# Patient Record
Sex: Male | Born: 1955 | Race: White | Hispanic: No | Smoking: Current every day smoker
Health system: Southern US, Community
[De-identification: ages and names within clinical notes are randomized; demographics above are authoritative.]

## PROBLEM LIST (undated history)

## (undated) DIAGNOSIS — E079 Disorder of thyroid, unspecified: Secondary | ICD-10-CM

## (undated) DIAGNOSIS — I10 Essential (primary) hypertension: Secondary | ICD-10-CM

## (undated) DIAGNOSIS — Z8619 Personal history of other infectious and parasitic diseases: Secondary | ICD-10-CM

## (undated) HISTORY — DX: Personal history of other infectious and parasitic diseases: Z86.19

## (undated) HISTORY — DX: Essential (primary) hypertension: I10

## (undated) HISTORY — DX: Disorder of thyroid, unspecified: E07.9

---

## 2011-12-09 ENCOUNTER — Emergency Department: Payer: Self-pay | Admitting: Emergency Medicine

## 2011-12-09 LAB — CBC
HCT: 47.1 % (ref 40.0–52.0)
HGB: 16.3 g/dL (ref 13.0–18.0)
MCHC: 34.5 g/dL (ref 32.0–36.0)
Platelet: 182 10*3/uL (ref 150–440)
RBC: 5.2 10*6/uL (ref 4.40–5.90)
WBC: 11.9 10*3/uL — ABNORMAL HIGH (ref 3.8–10.6)

## 2011-12-09 LAB — URINALYSIS, COMPLETE
Leukocyte Esterase: NEGATIVE
Nitrite: NEGATIVE
Ph: 7 (ref 4.5–8.0)
Protein: NEGATIVE
RBC,UR: 1 /HPF (ref 0–5)
WBC UR: NONE SEEN /HPF (ref 0–5)

## 2011-12-09 LAB — COMPREHENSIVE METABOLIC PANEL
Albumin: 3.5 g/dL (ref 3.4–5.0)
Alkaline Phosphatase: 105 U/L (ref 50–136)
Anion Gap: 11 (ref 7–16)
BUN: 14 mg/dL (ref 7–18)
Bilirubin,Total: 0.3 mg/dL (ref 0.2–1.0)
Chloride: 105 mmol/L (ref 98–107)
Creatinine: 1.11 mg/dL (ref 0.60–1.30)
EGFR (African American): >60
EGFR (Non-African Amer.): >60
Glucose: 101 mg/dL — ABNORMAL HIGH (ref 65–99)
Potassium: 4 mmol/L (ref 3.5–5.1)
SGPT (ALT): 24 U/L
Sodium: 141 mmol/L (ref 136–145)
Total Protein: 7.5 g/dL (ref 6.4–8.2)

## 2011-12-09 LAB — TROPONIN I: Troponin-I: <0.02 ng/mL

## 2014-11-14 LAB — BASIC METABOLIC PANEL
BUN: 13 mg/dL (ref 4–21)
Creatinine: 1.1 mg/dL (ref 0.6–1.3)
Glucose: 85 mg/dL
Potassium: 3.9 mmol/L (ref 3.4–5.3)
SODIUM: 140 mmol/L (ref 137–147)

## 2014-11-14 LAB — LIPID PANEL
Cholesterol: 162 mg/dL (ref 0–200)
HDL: 31 mg/dL — AB (ref 35–70)
LDL Cholesterol: 104 mg/dL
Triglycerides: 135 mg/dL (ref 40–160)

## 2014-11-14 LAB — TSH: TSH: 3.57 u[IU]/mL (ref 0.41–5.90)

## 2015-11-19 ENCOUNTER — Other Ambulatory Visit: Payer: Self-pay | Admitting: Family Medicine

## 2015-11-25 DIAGNOSIS — E039 Hypothyroidism, unspecified: Secondary | ICD-10-CM

## 2015-11-25 DIAGNOSIS — Z87891 Personal history of nicotine dependence: Secondary | ICD-10-CM | POA: Insufficient documentation

## 2015-11-25 DIAGNOSIS — I10 Essential (primary) hypertension: Secondary | ICD-10-CM

## 2015-11-25 DIAGNOSIS — Z720 Tobacco use: Secondary | ICD-10-CM

## 2015-11-27 ENCOUNTER — Encounter: Payer: Self-pay | Admitting: Family Medicine

## 2015-11-27 ENCOUNTER — Ambulatory Visit (INDEPENDENT_AMBULATORY_CARE_PROVIDER_SITE_OTHER): Payer: BLUE CROSS/BLUE SHIELD | Admitting: Family Medicine

## 2015-11-27 VITALS — BP 120/72 | HR 76 | Temp 98.0°F | Resp 18 | Wt 217.0 lb

## 2015-11-27 DIAGNOSIS — R079 Chest pain, unspecified: Secondary | ICD-10-CM

## 2015-11-27 MED ORDER — DEXLANSOPRAZOLE 60 MG PO CPDR: 60.0000 mg | DELAYED_RELEASE_CAPSULE | Freq: Every day | ORAL | Status: AC

## 2015-11-27 NOTE — Progress Notes (Signed)
Patient: Blake Rich Male    DOB: 12/03/1955   60 y.o.   MRN: [id number] Visit Date: 11/27/2015  Today's Provider: Mila Merryonald Fisher, MD   Chief Complaint  Patient presents with  . Chest Pain    x 2-3 days   Subjective:    Chest Pain  This is a new problem. Episode onset: started 2-3 days ago. The onset quality is sudden. The problem occurs intermittently. The pain is present in the substernal region. The pain is mild. The quality of the pain is described as sharp. The pain does not radiate. Pertinent negatives include no abdominal pain, back pain, claudication, cough, diaphoresis, dizziness, exertional chest pressure, fever, headaches, hemoptysis, irregular heartbeat, leg pain, lower extremity edema, malaise/fatigue, nausea, near-syncope, numbness, orthopnea, palpitations, PND, shortness of breath, sputum production, syncope, vomiting or weakness. He has tried antacids for the symptoms. The treatment provided mild relief.  Patient has tried taking rollaids which he reports didnt really help; pain resolved several hours later then returned the next day. Patient is concerned because both his mother and brother died of heart attack.  He states he noticed pain when he woke up in the morning and persistent for several hours after getting up. Is not exertional.   Lab Results  Component Value Date   CHOL 162 11/14/2014   HDL 31* 11/14/2014   LDLCALC 104 11/14/2014   TRIG 135 11/14/2014        No Known Allergies Previous Medications   ASPIRIN EC 81 MG TABLET    Take 81 mg by mouth daily.   LISINOPRIL-HYDROCHLOROTHIAZIDE (PRINZIDE,ZESTORETIC) 10-12.5 MG TABLET    TAKE 1 TABLET BY MOUTH EVERY DAY    Review of Systems  Constitutional: Negative for fever, chills, malaise/fatigue, diaphoresis, appetite change and fatigue.  Respiratory: Negative for cough, hemoptysis, sputum production, chest tightness, shortness of breath and wheezing.   Cardiovascular: Positive for chest pain.  Negative for palpitations, orthopnea, claudication, syncope, PND and near-syncope.  Gastrointestinal: Negative for nausea, vomiting and abdominal pain.  Musculoskeletal: Negative for back pain.  Neurological: Negative for dizziness, weakness, numbness and headaches.    Social History  Substance Use Topics  . Smoking status: Current Every Day Smoker -- 1.50 packs/day for 40 years    Types: Cigarettes  . Smokeless tobacco: Not on file  . Alcohol Use: No   Objective:   BP 120/72 mmHg  Pulse 76  Temp(Src) 98 F (36.7 C) (Oral)  Resp 18  Wt 217 lb (98.431 kg)  SpO2 97%  Physical Exam  General Appearance:    Alert, cooperative, no distress, obese  Eyes:    PERRL, conjunctiva/corneas clear, EOM's intact       Lungs:     Clear to auscultation bilaterally, respirations unlabored  Heart:    Regular rate and rhythm  Neurologic:   Awake, alert, oriented x 3. No apparent focal neurological           defect.       EKG: No ischemia changes. NSR    Assessment & Plan:     1. Chest pain, unspecified chest pain type Not typical for cardiac pain, but he does have family history CAD and hypertension. He is already on 81mg  ASA. Given samples Dexilant to take while evaluation is underway.  - EKG 12-Lead - CBC - Troponin I - Comprehensive metabolic panel - Myocardial Perfusion Imaging; Future       Mila Merryonald Fisher, MD  [facility]

## 2015-11-28 ENCOUNTER — Telehealth: Payer: Self-pay | Admitting: Family Medicine

## 2015-11-28 DIAGNOSIS — R079 Chest pain, unspecified: Secondary | ICD-10-CM

## 2015-11-28 LAB — COMPREHENSIVE METABOLIC PANEL
ALBUMIN: 3.7 g/dL (ref 3.6–4.8)
ALT: 11 IU/L (ref 0–44)
AST: 11 IU/L (ref 0–40)
Albumin/Globulin Ratio: 1.6 (ref 1.1–2.5)
Alkaline Phosphatase: 92 IU/L (ref 39–117)
BUN / CREAT RATIO: 11 (ref 10–22)
BUN: 13 mg/dL (ref 8–27)
Bilirubin Total: 0.4 mg/dL (ref 0.0–1.2)
CALCIUM: 8.8 mg/dL (ref 8.6–10.2)
CO2: 25 mmol/L (ref 18–29)
Chloride: 102 mmol/L (ref 96–106)
Creatinine, Ser: 1.23 mg/dL (ref 0.76–1.27)
GFR, EST AFRICAN AMERICAN: 73 mL/min/{1.73_m2} (ref >59)
GFR, EST NON AFRICAN AMERICAN: 63 mL/min/{1.73_m2} (ref >59)
GLUCOSE: 90 mg/dL (ref 65–99)
Globulin, Total: 2.3 g/dL (ref 1.5–4.5)
Potassium: 3.8 mmol/L (ref 3.5–5.2)
Sodium: 140 mmol/L (ref 134–144)
Total Protein: 6 g/dL (ref 6.0–8.5)

## 2015-11-28 LAB — TROPONIN I

## 2015-11-28 LAB — CBC
HEMATOCRIT: 46.1 % (ref 37.5–51.0)
HEMOGLOBIN: 15.5 g/dL (ref 12.6–17.7)
MCH: 30.5 pg (ref 26.6–33.0)
MCHC: 33.6 g/dL (ref 31.5–35.7)
MCV: 91 fL (ref 79–97)
Platelets: 196 10*3/uL (ref 150–379)
RBC: 5.09 x10E6/uL (ref 4.14–5.80)
RDW: 13.6 % (ref 12.3–15.4)
WBC: 9.8 10*3/uL (ref 3.4–10.8)

## 2015-11-28 NOTE — Telephone Encounter (Signed)
Code entered.  

## 2015-11-28 NOTE — Telephone Encounter (Signed)
Per [facility] the code used to order stress test should be [id number].

## 2015-12-08 ENCOUNTER — Telehealth: Payer: Self-pay | Admitting: Family Medicine

## 2015-12-08 NOTE — Telephone Encounter (Signed)
PT stated that he was supposed to have a stress test on Monday 12/11/15 but he canceled the appt because they wanted over 400$ up front. Pt stated he just wanted to let Dr. Sherrie MustacheFisher know. Thanks TNP

## 2015-12-11 ENCOUNTER — Ambulatory Visit: Payer: BLUE CROSS/BLUE SHIELD

## 2015-12-28 ENCOUNTER — Other Ambulatory Visit: Payer: Self-pay | Admitting: Family Medicine

## 2016-06-05 ENCOUNTER — Emergency Department: Payer: BLUE CROSS/BLUE SHIELD

## 2016-06-05 ENCOUNTER — Emergency Department
Admission: EM | Admit: 2016-06-05 | Discharge: 2016-06-05 | Disposition: A | Discharge status: Home / Self Care | Payer: BLUE CROSS/BLUE SHIELD | Attending: Emergency Medicine | Admitting: Emergency Medicine

## 2016-06-05 DIAGNOSIS — N200 Calculus of kidney: Secondary | ICD-10-CM

## 2016-06-05 DIAGNOSIS — F17210 Nicotine dependence, cigarettes, uncomplicated: Secondary | ICD-10-CM | POA: Insufficient documentation

## 2016-06-05 DIAGNOSIS — N201 Calculus of ureter: Secondary | ICD-10-CM | POA: Diagnosis not present

## 2016-06-05 DIAGNOSIS — Z79899 Other long term (current) drug therapy: Secondary | ICD-10-CM | POA: Insufficient documentation

## 2016-06-05 DIAGNOSIS — Z7982 Long term (current) use of aspirin: Secondary | ICD-10-CM | POA: Insufficient documentation

## 2016-06-05 DIAGNOSIS — I10 Essential (primary) hypertension: Secondary | ICD-10-CM | POA: Insufficient documentation

## 2016-06-05 DIAGNOSIS — E039 Hypothyroidism, unspecified: Secondary | ICD-10-CM | POA: Insufficient documentation

## 2016-06-05 DIAGNOSIS — R109 Unspecified abdominal pain: Secondary | ICD-10-CM | POA: Diagnosis present

## 2016-06-05 LAB — URINALYSIS COMPLETE WITH MICROSCOPIC (ARMC ONLY)
BILIRUBIN URINE: NEGATIVE
Bacteria, UA: NONE SEEN
GLUCOSE, UA: NEGATIVE mg/dL
KETONES UR: NEGATIVE mg/dL
LEUKOCYTES UA: NEGATIVE
NITRITE: NEGATIVE
Protein, ur: 30 mg/dL — AB
SPECIFIC GRAVITY, URINE: 1.028 (ref 1.005–1.030)
pH: 5 (ref 5.0–8.0)

## 2016-06-05 LAB — BASIC METABOLIC PANEL
ANION GAP: 10 (ref 5–15)
BUN: 16 mg/dL (ref 6–20)
CHLORIDE: 107 mmol/L (ref 101–111)
CO2: 21 mmol/L — ABNORMAL LOW (ref 22–32)
Calcium: 9.1 mg/dL (ref 8.9–10.3)
Creatinine, Ser: 1.36 mg/dL — ABNORMAL HIGH (ref 0.61–1.24)
GFR calc Af Amer: >60 mL/min (ref >60)
GFR, EST NON AFRICAN AMERICAN: 55 mL/min — AB (ref >60)
GLUCOSE: 158 mg/dL — AB (ref 65–99)
POTASSIUM: 3.6 mmol/L (ref 3.5–5.1)
Sodium: 138 mmol/L (ref 135–145)

## 2016-06-05 LAB — CBC
HEMATOCRIT: 50.5 % (ref 40.0–52.0)
HEMOGLOBIN: 17.7 g/dL (ref 13.0–18.0)
MCH: 31.4 pg (ref 26.0–34.0)
MCHC: 35 g/dL (ref 32.0–36.0)
MCV: 89.8 fL (ref 80.0–100.0)
Platelets: 233 10*3/uL (ref 150–440)
RBC: 5.63 MIL/uL (ref 4.40–5.90)
RDW: 13.2 % (ref 11.5–14.5)
WBC: 15.8 10*3/uL — ABNORMAL HIGH (ref 3.8–10.6)

## 2016-06-05 MED ORDER — OXYCODONE HCL 5 MG PO TABS: 5.0000 mg | ORAL_TABLET | Freq: Three times a day (TID) | ORAL | 0 refills | Status: AC | PRN

## 2016-06-05 MED ORDER — MORPHINE SULFATE (PF) 4 MG/ML IV SOLN
4.0000 mg | Freq: Once | INTRAVENOUS | Status: AC
  Administered 2016-06-05: 4 mg via INTRAVENOUS
  Filled 2016-06-05: qty 1

## 2016-06-05 MED ORDER — SODIUM CHLORIDE 0.9 % IV BOLUS (SEPSIS)
1000.0000 mL | Freq: Once | INTRAVENOUS | Status: AC
  Administered 2016-06-05: 1000 mL via INTRAVENOUS

## 2016-06-05 MED ORDER — PROMETHAZINE HCL 25 MG/ML IJ SOLN
INTRAMUSCULAR | Status: AC
  Filled 2016-06-05: qty 1

## 2016-06-05 MED ORDER — KETOROLAC TROMETHAMINE 30 MG/ML IJ SOLN
30.0000 mg | Freq: Once | INTRAMUSCULAR | Status: AC
  Administered 2016-06-05: 30 mg via INTRAVENOUS
  Filled 2016-06-05: qty 1

## 2016-06-05 MED ORDER — FENTANYL CITRATE (PF) 100 MCG/2ML IJ SOLN
INTRAMUSCULAR | Status: AC
  Administered 2016-06-05: 50 ug via INTRAVENOUS
  Filled 2016-06-05: qty 2

## 2016-06-05 MED ORDER — ONDANSETRON HCL 4 MG/2ML IJ SOLN
4.0000 mg | Freq: Once | INTRAMUSCULAR | Status: AC
  Administered 2016-06-05: 4 mg via INTRAVENOUS

## 2016-06-05 MED ORDER — FENTANYL CITRATE (PF) 100 MCG/2ML IJ SOLN
50.0000 ug | INTRAMUSCULAR | Status: AC | PRN
  Administered 2016-06-05 (×2): 50 ug via INTRAVENOUS
  Filled 2016-06-05: qty 2

## 2016-06-05 MED ORDER — ONDANSETRON HCL 4 MG/2ML IJ SOLN
4.0000 mg | Freq: Once | INTRAMUSCULAR | Status: AC
  Administered 2016-06-05: 4 mg via INTRAVENOUS
  Filled 2016-06-05: qty 2

## 2016-06-05 MED ORDER — PROMETHAZINE HCL 25 MG/ML IJ SOLN
25.0000 mg | Freq: Once | INTRAMUSCULAR | Status: AC
  Administered 2016-06-05: 25 mg via INTRAVENOUS
  Filled 2016-06-05: qty 1

## 2016-06-05 MED ORDER — ONDANSETRON HCL 4 MG/2ML IJ SOLN
INTRAMUSCULAR | Status: AC
Start: 2016-06-05 — End: 2016-06-05
  Administered 2016-06-05: 4 mg via INTRAVENOUS
  Filled 2016-06-05: qty 2

## 2016-06-05 MED ORDER — TAMSULOSIN HCL 0.4 MG PO CAPS: 0.4000 mg | ORAL_CAPSULE | Freq: Every day | ORAL | 0 refills | Status: AC

## 2016-06-05 NOTE — ED Triage Notes (Signed)
Pt c/o left flank pain with decreased urinary output since yesterday.. Denies hx of kidney issues in the past

## 2016-06-05 NOTE — Discharge Instructions (Signed)
Return to the emergency department for any increased pain, fever, or painful urination.

## 2016-06-05 NOTE — ED Provider Notes (Signed)
[facility] Emergency Department Provider Note  Time seen: 1:42 PM  I have reviewed the triage vital signs and the nursing notes.   HISTORY  Chief Complaint Flank Pain    HPI Blake Rich is a 60 y.o. male with a past medical history of hypertension who presents the emergency department with left flank pain. According to the patient beginning yesterday evening he developed left flank pain which has worsened. Today the patient has been very nauseated with several episodes of vomiting at home. Denies diarrhea or constipation, states normal bowel movements. Denies dysuria or hematuria. However the patient does state he feels like he needs to urinate but cannot urinate much. Denies fever. Denies history of kidney stones.  Past Medical History:  Diagnosis Date  . History of chicken pox   . History of measles   . Hypertension   . Thyroid disease     Patient Active Problem List   Diagnosis Date Noted  . Hypothyroidism 11/25/2015  . Hypertension 11/25/2015  . History of tobacco abuse 11/25/2015    History reviewed. No pertinent surgical history.  Prior to Admission medications   Medication Sig Start Date End Date Taking? Authorizing Provider  aspirin EC 81 MG tablet Take 81 mg by mouth daily.    Historical Provider, MD  dexlansoprazole (DEXILANT) 60 MG capsule Take 1 capsule (60 mg total) by mouth daily. 11/27/15 12/27/15  Malva Limes, MD  lisinopril-hydrochlorothiazide (PRINZIDE,ZESTORETIC) 10-12.5 MG tablet TAKE 1 TABLET BY MOUTH EVERY DAY 12/28/15   Malva Limes, MD    No Known Allergies  Family History  Problem Relation Age of Onset  . Hypertension Mother   . Heart attack Mother   . Suicidality Father   . Cancer Maternal Grandmother   . Cancer Paternal Grandmother   . Stroke Paternal Grandfather   . Drug abuse Brother   . Heart attack Brother     Social History Social History  Substance Use Topics  . Smoking status: Current Every Day  Smoker    Packs/day: 1.50    Years: 40.00    Types: Cigarettes  . Smokeless tobacco: Never Used  . Alcohol use No    Review of Systems Constitutional: Negative for fever Cardiovascular: Negative for chest pain. Respiratory: Negative for shortness of breath. Gastrointestinal: Left flank pain. Positive for nausea or vomiting. Negative for diarrhea or constipation. Genitourinary: Negative for dysuria. Negative for hematuria. Musculoskeletal: Left flank pain. Neurological: Negative for headache 10-point ROS otherwise negative.  ____________________________________________   PHYSICAL EXAM:  VITAL SIGNS: ED Triage Vitals  Enc Vitals Group     BP 06/05/16 1137 103/72     Pulse Rate 06/05/16 1136 70     Resp 06/05/16 1136 17     Temp 06/05/16 1136 97.8 F (36.6 C)     Temp Source 06/05/16 1136 Oral     SpO2 06/05/16 1136 97 %     Weight 06/05/16 1136 200 lb (90.7 kg)     Height 06/05/16 1136 5\' 10"  (1.778 m)     Head Circumference --      Peak Flow --      Pain Score 06/05/16 1136 10     Pain Loc --      Pain Edu? --      Excl. in GC? --     Constitutional: Alert and oriented. Mild distress due to left flank pain and nausea. Eyes: Normal exam ENT   Head: Normocephalic and atraumatic   Mouth/Throat: Mucous membranes  are moist. Cardiovascular: Normal rate, regular rhythm. No murmur Respiratory: Normal respiratory effort without tachypnea nor retractions. Breath sounds are clear Gastrointestinal: Soft and nontender. No distention.   Musculoskeletal: Nontender with normal range of motion in all extremities.  Neurologic:  Normal speech and language. No gross focal neurologic deficits Skin:  Skin is warm, dry and intact.  Psychiatric: Mood and affect are normal.   ____________________________________________     RADIOLOGY  CT consistent 2-3 millimeter left UVJ stone.   INITIAL IMPRESSION / ASSESSMENT AND PLAN / ED COURSE  Pertinent labs & imaging results  that were available during my care of the patient were reviewed by me and considered in my medical decision making (see chart for details).  The patient presents the emergency department with left flank pain. Patient describes moderate aching pain. Nausea and vomiting today. No dysuria or hematuria. Labs show an elevated white blood cell count of 15,000, urinalysis pending, CT renal scan pending.  CT consistent 2-3 mm left UVJ stone. Patient states his pain is improved after Toradol. Patient will be discharged home with urology follow-up, Percocet and Flomax.   ____________________________________________   FINAL CLINICAL IMPRESSION(S) / ED DIAGNOSES  Ureterolithiasis Left flank pain    Minna AntisKevin Ellah Otte, MD 06/05/16 1540

## 2016-06-05 NOTE — ED Provider Notes (Signed)
-----------------------------------------   3:38 PM on 06/05/2016 -----------------------------------------   Blood pressure (!) 156/98, pulse 82, temperature 97.8 F (36.6 C), temperature source Oral, resp. rate 16, height 5\' 10"  (1.778 m), weight 90.7 kg, SpO2 94 %.  Assuming care from Dr. Lenard LancePaduchowski.  In short, Blake Rich is a 60 y.o. male with a chief complaint of Flank Pain .  Refer to the original H&P for additional details.  The current plan of care is to follow up on the urinalysis to determine if the patient needs antibiotics prior to discharge..   ----------------------------------------- 4:07 PM on 06/05/2016 -----------------------------------------  The patient's urinalysis shows too numerous to count red cells but there is no evidence of leukocytes, nitrites, no bacteria seen, and WBCs 6-30.  In short, no evidence of infection.  Culture pending.  No need for antibiotics at this time.  Discharging per Dr. Awanda MinkPaduchowski's recommendations. Loleta Rose.    Yuvonne Lanahan, MD 06/05/16 [phone]

## 2016-06-05 NOTE — ED Notes (Signed)
Pt. Verbalizes understanding of d/c instructions, prescriptions, and follow-up. VS stable and pain relieved per pt report.  Pt. In NAD at time of d/c and denies further concerns regarding this visit. Pt. Stable at the time of departure from the unit, departing unit by the safest and most appropriate manner per that pt condition and limitations. Pt advised to return to the ED at any time for emergent concerns, or for new/worsening symptoms.

## 2016-06-05 NOTE — ED Notes (Signed)
Pt states he has something wrong with his kidneys on the left - pt states he is unable to void since this am and states since yesterday has only voided small amounts at a time - pt denies history of kidney stone - pt nauseated and vomited x2 since at the er

## 2016-06-06 LAB — URINE CULTURE: CULTURE: NO GROWTH

## 2016-06-26 ENCOUNTER — Other Ambulatory Visit: Payer: Self-pay | Admitting: Family Medicine

## 2017-01-29 ENCOUNTER — Other Ambulatory Visit: Payer: Self-pay | Admitting: Family Medicine

## 2017-03-02 ENCOUNTER — Other Ambulatory Visit: Payer: Self-pay | Admitting: Family Medicine

## 2017-04-05 ENCOUNTER — Other Ambulatory Visit: Payer: Self-pay | Admitting: Family Medicine

## 2017-11-12 ENCOUNTER — Other Ambulatory Visit: Payer: Self-pay | Admitting: Family Medicine

## 2017-11-12 MED ORDER — LISINOPRIL-HYDROCHLOROTHIAZIDE 10-12.5 MG PO TABS: 1.0000 | ORAL_TABLET | Freq: Every day | ORAL | 0 refills | Status: DC

## 2017-11-12 NOTE — Telephone Encounter (Signed)
Please review. Thanks!  

## 2017-11-12 NOTE — Telephone Encounter (Signed)
[facility] faxed a refill request for a 90-days supply for the following medication. Thanks CC  lisinopril-hydrochlorothiazide (PRINZIDE,ZESTORETIC) 10-12.5 MG tablet

## 2017-11-14 ENCOUNTER — Other Ambulatory Visit: Payer: Self-pay | Admitting: Family Medicine

## 2017-11-14 NOTE — Telephone Encounter (Signed)
[facility] faxed refill request for a 90-days supply for the following medication. Thanks CC  lisinopril-hydrochlorothiazide (PRINZIDE,ZESTORETIC) 10-12.5 MG tablet

## 2017-11-14 NOTE — Telephone Encounter (Signed)
Requesting 90 day supply.

## 2017-11-14 NOTE — Telephone Encounter (Signed)
Patient has not been seen since 2017 and needs to make an appointment. Pharmacy can dispense #30 to get by until patient is seen in office.

## 2017-11-18 NOTE — Telephone Encounter (Signed)
Left message to call back  

## 2017-12-29 ENCOUNTER — Other Ambulatory Visit: Payer: Self-pay | Admitting: Family Medicine

## 2017-12-29 MED ORDER — LISINOPRIL-HYDROCHLOROTHIAZIDE 10-12.5 MG PO TABS: 1.0000 | ORAL_TABLET | Freq: Every day | ORAL | 0 refills | Status: DC

## 2017-12-29 NOTE — Telephone Encounter (Signed)
Patient needs refills on  Lisinopril-HCTZ 10-12.5 mg. Called [facility] [address]

## 2018-01-04 IMAGING — CT CT RENAL STONE PROTOCOL
2 of 4 series · 15 of 46 positions shown, 17 images · non-contrast
Comparison: None.

CLINICAL DATA: Left-sided abdominal pain with nausea, vomiting and
decreased urinary output since yesterday. No history of kidney
stones, malignancy or prior relevant surgery.

EXAM:
CT ABDOMEN AND PELVIS WITHOUT CONTRAST
TECHNIQUE: Multidetector CT imaging of the abdomen and pelvis was performed
following the standard protocol without IV contrast.

[Series 2: axial st · axial · 0.73mm/px · z∈[-953,-503]mm · 12 of 104 slices shown, 14 images]
[im 9/104  soft-tissue]
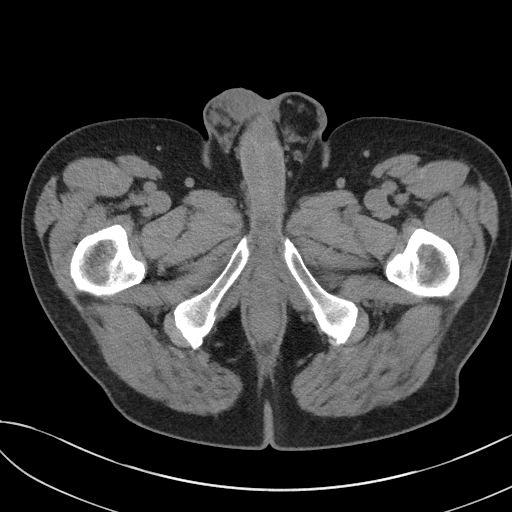
[im 9/104  bone]
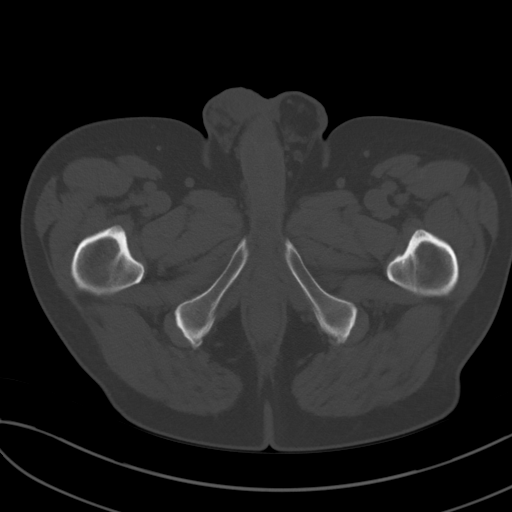
[im 17/104  soft-tissue]
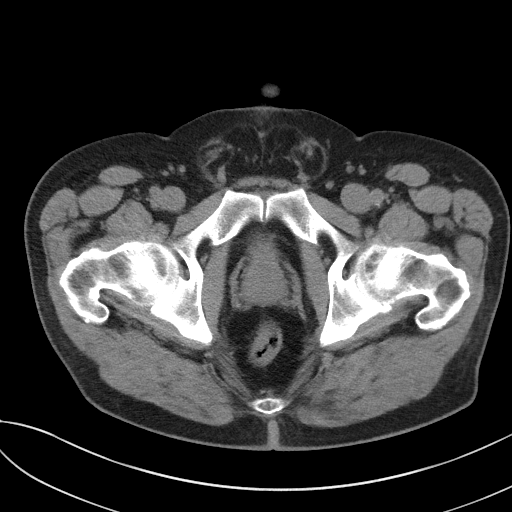
[im 25/104  soft-tissue]
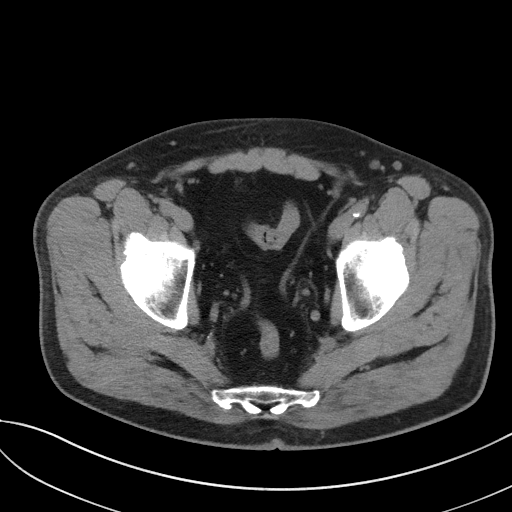
[im 33/104  soft-tissue]
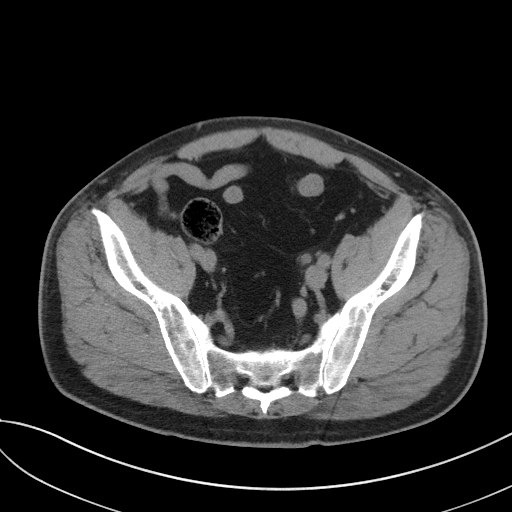
[im 42/104  soft-tissue]
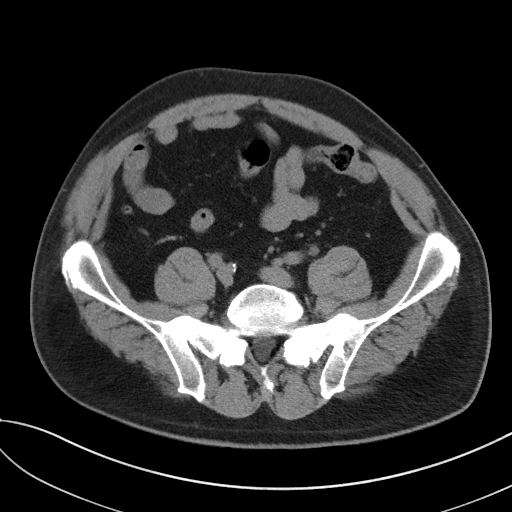
[im 50/104  soft-tissue]
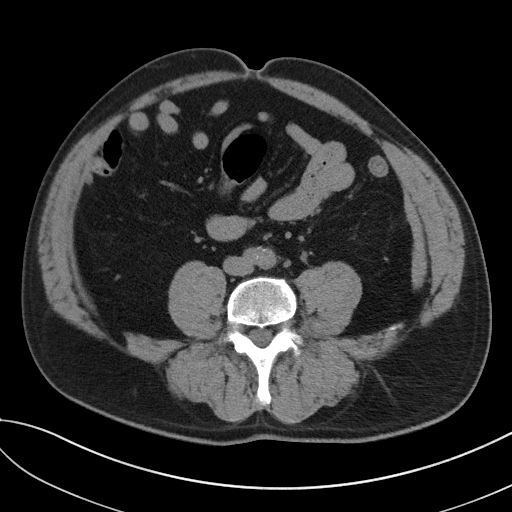
[im 58/104  soft-tissue]
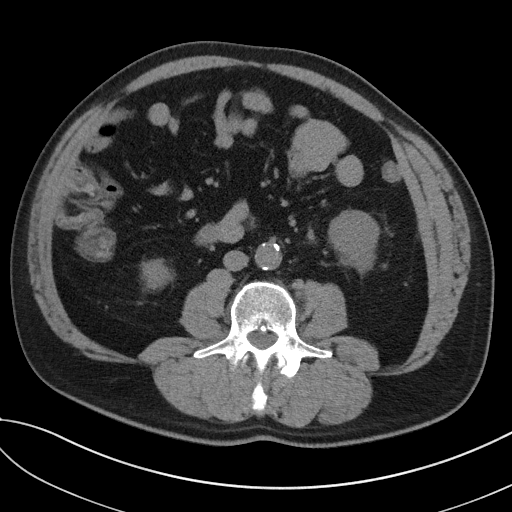
[im 66/104  soft-tissue]
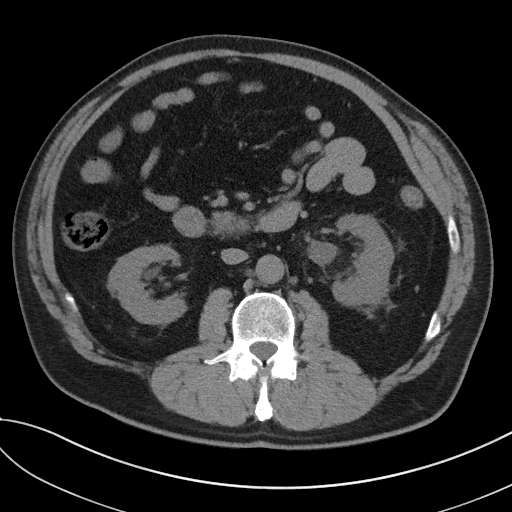
[im 75/104  soft-tissue]
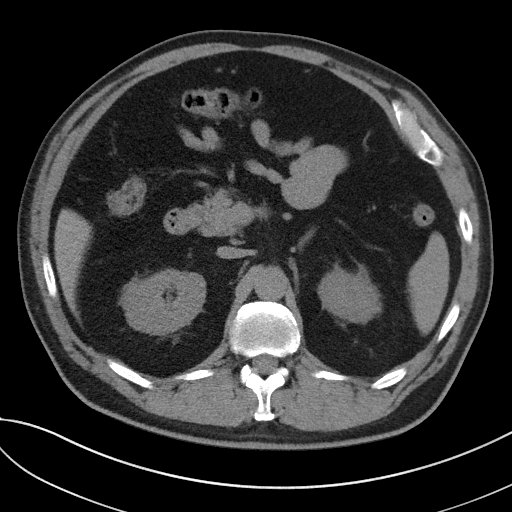
[im 75/104  bone]
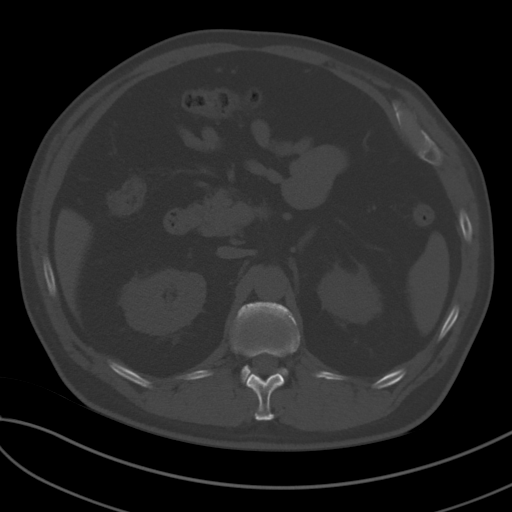
[im 83/104  soft-tissue]
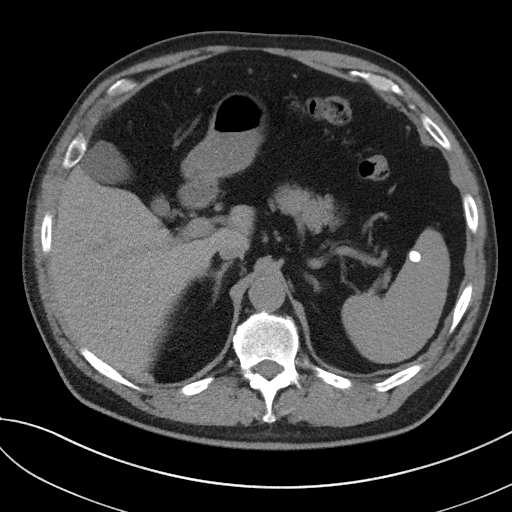
[im 91/104  soft-tissue]
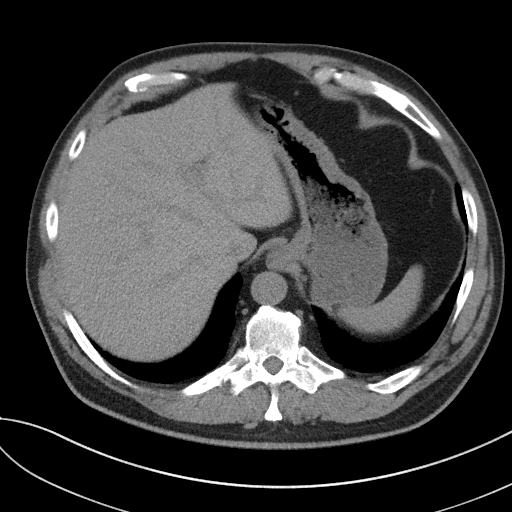
[im 99/104  soft-tissue]
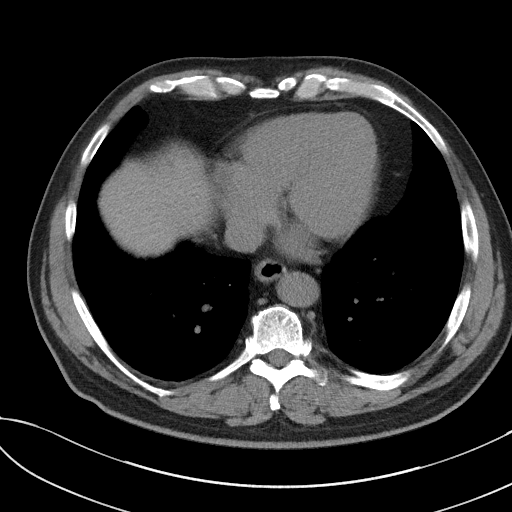

[Series 6: coronal · coronal · 0.82mm/px · 3 of 155 slices shown]
[im 52/155  soft-tissue]
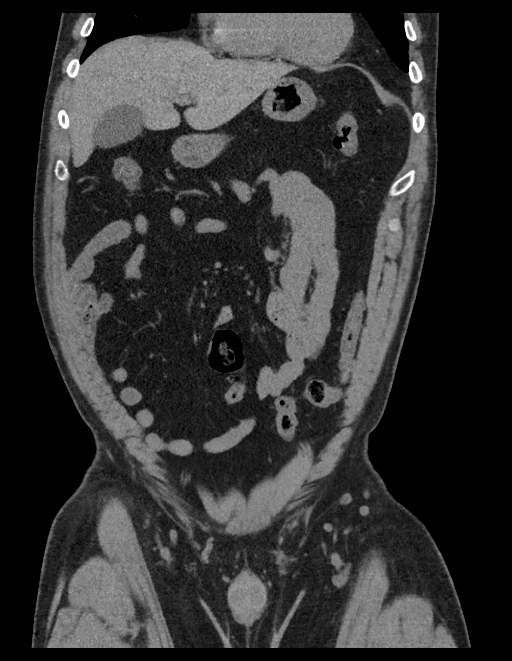
[im 69/155  soft-tissue]
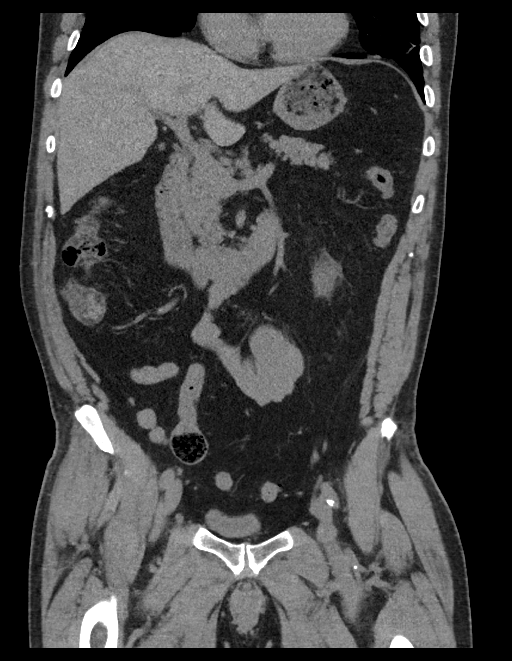
[im 86/155  soft-tissue]
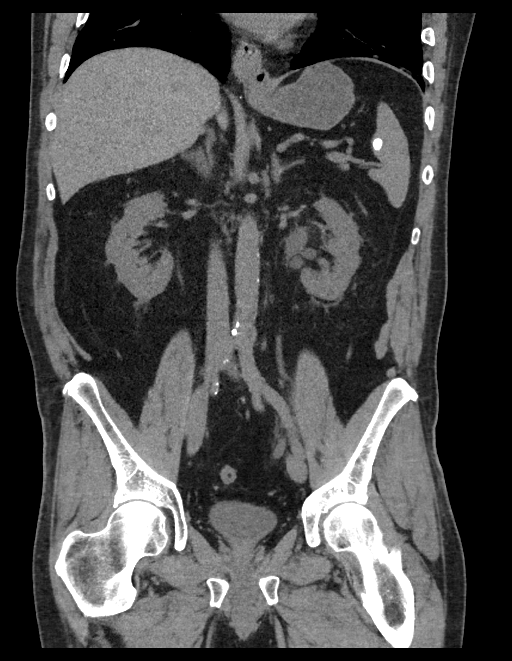

[15 of 46 positions shown; findings below may reference images not displayed]

FINDINGS: Lower chest: Clear lung bases. No significant pleural or pericardial
effusion. There is a small hiatal hernia.

Hepatobiliary: As evaluated in the noncontrast state, the liver
appears unremarkable without focal abnormality. No evidence of
gallstones, gallbladder wall thickening or biliary dilatation.

Pancreas: Unremarkable. No pancreatic ductal dilatation or
surrounding inflammatory changes.

Spleen: Calcified granulomas are noted. The spleen is normal in size
without focally suspicious finding.

Adrenals/Urinary Tract: Both adrenal glands appear normal. There is
left-sided hydronephrosis and hydroureter secondary to a 2 or 3 mm
calculus at the left ureterovesical junction (image 84). No other
urinary tract calculi are demonstrated. There is asymmetric
perinephric and periureteral soft tissue stranding on the left.
There is a small exophytic lesion projecting posteriorly from the
mid right kidney. This measures 23 HU and 11 mm on image 35. No
other focal renal lesions are identified on noncontrast imaging. The
bladder appears unremarkable for its degree of distention.

Stomach/Bowel: No evidence of bowel wall thickening, distention or
surrounding inflammatory change. The appendix appears normal.

Vascular/Lymphatic: There are no enlarged abdominal or pelvic lymph
nodes. Mild aortic and branch vessel atherosclerosis.

Reproductive: The prostate gland and seminal vesicles appear
unremarkable.

Other: No evidence of abdominal wall mass or hernia.

Musculoskeletal: No acute or significant osseous findings. There are
degenerative changes and partial ankylosis of the sacroiliac joints
bilaterally.
IMPRESSION: 1. Obstructing 2 or 3 mm calculus at the left ureterovesical
junction.
2. No other urinary tract calculi.
3. Indeterminate small exophytic lesion in the mid right kidney. The
patient had a prior abdominal CT 03/25/1996 which is not available
for comparison. This did report a "small low-attenuation slight
exophytic lesion from the posterior margin of the midportion of the
right kidney". Assuming this is the same lesion, this is probably a
mildly complex cyst, although is incompletely characterized by this
noncontrast study.

## 2018-01-06 ENCOUNTER — Telehealth: Payer: Self-pay | Admitting: Family Medicine

## 2018-01-06 NOTE — Telephone Encounter (Signed)
Pt called stated that his lisinopril-hydrochlorothiazide (PRINZIDE,ZESTORETIC) 10-12.5 MG tablet was only filled for 10 days. I explained that was because Dr. Sherrie MustacheFisher needs pt to come in for follow up for more refills. Pt stated that he wasn't going to come in when there isn't anything wrong with him. I advised pt that in order to continue prescribing the medication we have to see pt for follow ups to make sure the medication is appropriate. Pt stated no it wasn't that Dr. Sherrie MustacheFisher just wanted to get 300$ from his insurance. I advised pt that he hasn't been seen since March 2017 and offered to schedule appt. Pt stated no that he would get this handled and hung up. Please advise. Thanks TNP

## 2018-01-06 NOTE — Telephone Encounter (Signed)
Please advise 

## 2018-01-09 ENCOUNTER — Other Ambulatory Visit: Payer: Self-pay | Admitting: Family Medicine

## 2018-01-09 MED ORDER — LISINOPRIL-HYDROCHLOROTHIAZIDE 10-12.5 MG PO TABS
1.0000 | ORAL_TABLET | Freq: Every day | ORAL | 0 refills | Status: DC
Start: 2018-01-09 — End: 2018-02-09

## 2018-01-09 NOTE — Telephone Encounter (Signed)
I have sent a 30 day prescription for this patient to his pharmacy. He needs to be discharged from practice for non-compliance with required follow up.

## 2018-01-09 NOTE — Progress Notes (Unsigned)
I have sent a 30 day prescription for this patient to his pharmacy. He needs to be discharged from practice for non-compliance with required follow up.  

## 2018-01-12 ENCOUNTER — Other Ambulatory Visit: Payer: Self-pay | Admitting: Family Medicine

## 2018-01-12 NOTE — Telephone Encounter (Signed)
Patient has not been seen for follow up of hypertension medications since November 27, 2015 and refuses to return for follow up. Due to non-compliance with medically necessary follow up of medications a letter is being sent to discharge him from practice.

## 2018-02-09 ENCOUNTER — Other Ambulatory Visit: Payer: Self-pay | Admitting: Family Medicine

## 2023-11-21 ENCOUNTER — Emergency Department
Admission: EM | Admit: 2023-11-21 | Discharge: 2023-11-21 | Disposition: A | Discharge status: Home / Self Care | Payer: Medicare PPO | Attending: Emergency Medicine | Admitting: Emergency Medicine

## 2023-11-21 ENCOUNTER — Emergency Department: Payer: Medicare PPO

## 2023-11-21 ENCOUNTER — Other Ambulatory Visit: Payer: Self-pay

## 2023-11-21 DIAGNOSIS — I10 Essential (primary) hypertension: Secondary | ICD-10-CM | POA: Diagnosis not present

## 2023-11-21 DIAGNOSIS — N179 Acute kidney failure, unspecified: Secondary | ICD-10-CM | POA: Diagnosis not present

## 2023-11-21 DIAGNOSIS — R0602 Shortness of breath: Secondary | ICD-10-CM | POA: Diagnosis present

## 2023-11-21 DIAGNOSIS — R55 Syncope and collapse: Secondary | ICD-10-CM | POA: Insufficient documentation

## 2023-11-21 DIAGNOSIS — F17200 Nicotine dependence, unspecified, uncomplicated: Secondary | ICD-10-CM | POA: Diagnosis not present

## 2023-11-21 DIAGNOSIS — J40 Bronchitis, not specified as acute or chronic: Secondary | ICD-10-CM | POA: Insufficient documentation

## 2023-11-21 DIAGNOSIS — E860 Dehydration: Secondary | ICD-10-CM | POA: Diagnosis not present

## 2023-11-21 DIAGNOSIS — R079 Chest pain, unspecified: Secondary | ICD-10-CM

## 2023-11-21 LAB — COMPREHENSIVE METABOLIC PANEL
ALT: 45 U/L — ABNORMAL HIGH (ref 0–44)
AST: 40 U/L (ref 15–41)
Albumin: 3.4 g/dL — ABNORMAL LOW (ref 3.5–5.0)
Alkaline Phosphatase: 78 U/L (ref 38–126)
Anion gap: 15 (ref 5–15)
BUN: 19 mg/dL (ref 8–23)
CO2: 21 mmol/L — ABNORMAL LOW (ref 22–32)
Calcium: 7.9 mg/dL — ABNORMAL LOW (ref 8.9–10.3)
Chloride: 95 mmol/L — ABNORMAL LOW (ref 98–111)
Creatinine, Ser: 1.86 mg/dL — ABNORMAL HIGH (ref 0.61–1.24)
GFR, Estimated: 39 mL/min — ABNORMAL LOW (ref >60)
Glucose, Bld: 189 mg/dL — ABNORMAL HIGH (ref 70–99)
Potassium: 3.4 mmol/L — ABNORMAL LOW (ref 3.5–5.1)
Sodium: 131 mmol/L — ABNORMAL LOW (ref 135–145)
Total Bilirubin: 0.9 mg/dL (ref 0.0–1.2)
Total Protein: 6.5 g/dL (ref 6.5–8.1)

## 2023-11-21 LAB — CBC WITH DIFFERENTIAL/PLATELET
Abs Immature Granulocytes: 0.03 10*3/uL (ref 0.00–0.07)
Basophils Absolute: 0 10*3/uL (ref 0.0–0.1)
Basophils Relative: 0 %
Eosinophils Absolute: 0 10*3/uL (ref 0.0–0.5)
Eosinophils Relative: 0 %
HCT: 46.2 % (ref 39.0–52.0)
Hemoglobin: 15.7 g/dL (ref 13.0–17.0)
Immature Granulocytes: 0 %
Lymphocytes Relative: 16 %
Lymphs Abs: 1.2 10*3/uL (ref 0.7–4.0)
MCH: 31.1 pg (ref 26.0–34.0)
MCHC: 34 g/dL (ref 30.0–36.0)
MCV: 91.5 fL (ref 80.0–100.0)
Monocytes Absolute: 0.7 10*3/uL (ref 0.1–1.0)
Monocytes Relative: 9 %
Neutro Abs: 5.3 10*3/uL (ref 1.7–7.7)
Neutrophils Relative %: 75 %
Platelets: 181 10*3/uL (ref 150–400)
RBC: 5.05 MIL/uL (ref 4.22–5.81)
RDW: 13.2 % (ref 11.5–15.5)
WBC: 7.2 10*3/uL (ref 4.0–10.5)
nRBC: 0 % (ref 0.0–0.2)

## 2023-11-21 LAB — D-DIMER, QUANTITATIVE: D-Dimer, Quant: 0.7 ug{FEU}/mL — ABNORMAL HIGH (ref 0.00–0.50)

## 2023-11-21 LAB — APTT: aPTT: 32 s (ref 24–36)

## 2023-11-21 LAB — PROTIME-INR
INR: 1.1 (ref 0.8–1.2)
Prothrombin Time: 14.1 s (ref 11.4–15.2)

## 2023-11-21 LAB — BLOOD GAS, VENOUS
Acid-base deficit: 2.2 mmol/L — ABNORMAL HIGH (ref 0.0–2.0)
Bicarbonate: 24.7 mmol/L (ref 20.0–28.0)
O2 Saturation: 54.2 %
Patient temperature: 37
pCO2, Ven: 49 mmHg (ref 44–60)
pH, Ven: 7.31 (ref 7.25–7.43)
pO2, Ven: 33 mmHg (ref 32–45)

## 2023-11-21 LAB — T4, FREE: Free T4: 1.24 ng/dL — ABNORMAL HIGH (ref 0.61–1.12)

## 2023-11-21 LAB — LIPASE, BLOOD: Lipase: 26 U/L (ref 11–51)

## 2023-11-21 LAB — TROPONIN I (HIGH SENSITIVITY)
Troponin I (High Sensitivity): 7 ng/L (ref <18)
Troponin I (High Sensitivity): 6 ng/L (ref <18)

## 2023-11-21 LAB — LACTIC ACID, PLASMA
Lactic Acid, Venous: 2.6 mmol/L (ref 0.5–1.9)
Lactic Acid, Venous: 1.7 mmol/L (ref 0.5–1.9)

## 2023-11-21 LAB — BRAIN NATRIURETIC PEPTIDE: B Natriuretic Peptide: 4.4 pg/mL (ref 0.0–100.0)

## 2023-11-21 LAB — TSH: TSH: 6.706 u[IU]/mL — ABNORMAL HIGH (ref 0.350–4.500)

## 2023-11-21 MED ORDER — SODIUM CHLORIDE 0.9 % IV BOLUS
1000.0000 mL | Freq: Once | INTRAVENOUS | Status: AC
  Administered 2023-11-21: 1000 mL via INTRAVENOUS

## 2023-11-21 MED ORDER — DOXYCYCLINE HYCLATE 50 MG PO CAPS: 100.0000 mg | ORAL_CAPSULE | Freq: Two times a day (BID) | ORAL | 0 refills | Status: AC

## 2023-11-21 MED ORDER — ASPIRIN 81 MG PO CHEW
324.0000 mg | CHEWABLE_TABLET | Freq: Once | ORAL | Status: AC
  Administered 2023-11-21: 324 mg via ORAL
  Filled 2023-11-21: qty 4

## 2023-11-21 MED ORDER — DOXYCYCLINE HYCLATE 100 MG PO TABS
100.0000 mg | ORAL_TABLET | Freq: Once | ORAL | Status: AC
  Administered 2023-11-21: 100 mg via ORAL
  Filled 2023-11-21: qty 1

## 2023-11-21 NOTE — ED Triage Notes (Addendum)
 Pt BIB [facility] after being called out for pts brother. Pt began to have chest pain and asked them to check his VS. First BP was 77/43, second was 67/37. Pt c/o mid sternal chest pain. Pt reports N/V and feeling SOB. EMS admin 4 Zofran and 700 NS

## 2023-11-21 NOTE — ED Provider Notes (Addendum)
 [facility] Provider Note    Event Date/Time   First MD Initiated Contact with Patient 11/21/23 [phone]     (approximate)   History   Hypotension   HPI  Blake Rich is a 68 y.o. male   Past medical history of daily smoker, hypertension, thyroid disease, who presents to the emergency department with presyncopal episode at home.  His brother had fall in the shower and he was straining himself to try to get him up, and in doing so he developed nausea, lightheadedness, chest pain and shortness of breath.  EMS had come to assist the brother but when checking Blake Rich vital signs noted that he was hypotensive and looked pale and so he was sent to the emergency department for evaluation.  Since then, he feels much better now.  He still is borderline hypotensive, and feeling mildly short of breath but no longer has chest pain.  He says that he is otherwise been in his regular state of health and before this event tonight had no acute medical complaints.  Has had no recent illnesses, no nausea vomiting diarrhea, respiratory complaints and has not had chest pain like this in the past.   External Medical Documents Reviewed: [facility] health clinical summary for past medical history noted a stress test from 2017 that was normal aside from poor exercise tolerance      Physical Exam   Triage Vital Signs: ED Triage Vitals  Encounter Vitals Group     BP 11/21/23 0355 93/81     Systolic BP Percentile --      Diastolic BP Percentile --      Pulse Rate 11/21/23 0355 93     Resp 11/21/23 0355 (!) 22     Temp 11/21/23 0356 97.7 F (36.5 C)     Temp Source 11/21/23 0355 Oral     SpO2 11/21/23 0355 100 %     Weight 11/21/23 0356 235 lb (106.6 kg)     Height 11/21/23 0356 5\' 10"  (1.778 m)     Head Circumference --      Peak Flow --      Pain Score 11/21/23 0355 4     Pain Loc --      Pain Education --      Exclude from Growth Chart --     Most recent vital  signs: Vitals:   11/21/23 0500 11/21/23 0600  BP: 115/71 98/72  Pulse: 80 83  Resp: 19 (!) 21  Temp:    SpO2: 98% 98%    General: Awake, no distress.  CV:  Good peripheral perfusion.  Resp:  Normal effort.  Abd:  No distention.  Other:  Is mildly hypotensive in the systolic 90s, otherwise looks well answering questions appropriately and looks comfortable.  His lungs are clear to auscultation bilaterally bedside ultrasound has poor views but no obvious effusions, right heart strain and no B-lines noted.  His abdomen is soft and nontender deep palpation all quadrants his radial pulses are intact and equal bilaterally.   ED Results / Procedures / Treatments   Labs (all labs ordered are listed, but only abnormal results are displayed) Labs Reviewed  LACTIC ACID, PLASMA - Abnormal; Notable for the following components:      Result Value   Lactic Acid, Venous 2.6 (*)    All other components within normal limits  COMPREHENSIVE METABOLIC PANEL - Abnormal; Notable for the following components:   Sodium 131 (*)    Potassium 3.4 (*)  Chloride 95 (*)    CO2 21 (*)    Glucose, Bld 189 (*)    Creatinine, Ser 1.86 (*)    Calcium 7.9 (*)    Albumin 3.4 (*)    ALT 45 (*)    GFR, Estimated 39 (*)    All other components within normal limits  BLOOD GAS, VENOUS - Abnormal; Notable for the following components:   Acid-base deficit 2.2 (*)    All other components within normal limits  TSH - Abnormal; Notable for the following components:   TSH 6.706 (*)    All other components within normal limits  T4, FREE - Abnormal; Notable for the following components:   Free T4 1.24 (*)    All other components within normal limits  D-DIMER, QUANTITATIVE - Abnormal; Notable for the following components:   D-Dimer, Quant 0.70 (*)    All other components within normal limits  CULTURE, BLOOD (ROUTINE X 2)  CULTURE, BLOOD (ROUTINE X 2)  LACTIC ACID, PLASMA  CBC WITH DIFFERENTIAL/PLATELET   PROTIME-INR  APTT  BRAIN NATRIURETIC PEPTIDE  LIPASE, BLOOD  TROPONIN I (HIGH SENSITIVITY)  TROPONIN I (HIGH SENSITIVITY)     I ordered and reviewed the above labs they are notable for cell counts are unremarkable.  EKG  ED ECG REPORT I, Pilar Jarvis, the attending physician, personally viewed and interpreted this ECG.   Date: 11/21/2023  EKG Time: 0400  Rate: 91  Rhythm: sinus  Axis: nl  Intervals:none  ST&T Change: no stemi    RADIOLOGY I independently reviewed and interpreted chest x-ray and I see no obvious focality pneumothorax I also reviewed radiologist's formal read.   PROCEDURES:  Critical Care performed: No  Procedures   MEDICATIONS ORDERED IN ED: Medications  doxycycline (VIBRA-TABS) tablet 100 mg (has no administration in time range)  aspirin chewable tablet 324 mg (324 mg Oral Given 11/21/23 0424)  sodium chloride 0.9 % bolus 1,000 mL (1,000 mLs Intravenous New Bag/Given 11/21/23 0425)  sodium chloride 0.9 % bolus 1,000 mL (1,000 mLs Intravenous New Bag/Given 11/21/23 0521)     IMPRESSION / MDM / ASSESSMENT AND PLAN / ED COURSE  I reviewed the triage vital signs and the nursing notes.                                Patient's presentation is most consistent with acute presentation with potential threat to life or bodily function.  Differential diagnosis includes, but is not limited to, vasovagal reaction, ACS, PE, dehydration or electrolyte derangement, sepsis, dissection   The patient is on the cardiac monitor to evaluate for evidence of arrhythmia and/or significant heart rate changes.  MDM:    Mostly his symptoms have resolved except he continues to feel mildly short of breath and is slightly hypotensive.  Otherwise this appears to be consistent with a vasovagal reaction and that after exerting himself he felt lightheaded, nauseated, diaphoretic, along with chest pain and shortness of breath that quickly resolved.  He reports no other acute  medical complaints and is otherwise felt well.  He no longer has chest pain and his EKG is nonischemic so I doubt cardiac ischemia though I think it prudent with his age and cardiac risk factors to check serial troponins.  Give fluids and keep a close monitor on blood pressure.  Get a D-dimer to risk stratify for PE/dissection though low clinical suspicion - Age-Adjusted D-dimer for Venous Thromboembolism (VTE) from [email or web address]  is less than or equal to cutoff therefore VTE unlikely  He has had a cough recently, check CXR.  No urinary sx, doubt UTI.   ---   He feels much better.    He tells me that he has had nausea and poor p.o. intake over the last several days and usually drinks sweet tea or soda at home, little water.  Has had a mild cough as well.    Blood pressure has normalized after crystalloids.    Lactic and troponin repeat unremarkable, patient stable and eager to go home.  I discussed with him his findings and my concern for his initial lactic acidosis his presyncopal event his cough and offered admission however the patient states he feels very well now and would like to go home and follow-up with his primary doctor.  Most current blood pressure is 140/100.  Chest x-ray still pending a final read and on my read there is no obvious focal opacity though perhaps some hilar opacities bilaterally which may represent atypical pneumonia in the setting of a cough recently we will give short course of doxycycline for coverage.        FINAL CLINICAL IMPRESSION(S) / ED DIAGNOSES   Final diagnoses:  Nonspecific chest pain  Near syncope  AKI (acute kidney injury) (HCC)  Dehydration  Bronchitis     Rx / DC Orders   ED Discharge Orders          Ordered    doxycycline (VIBRAMYCIN) 50 MG capsule  2 times daily        11/21/23 0644             Note:  This document was prepared using Dragon voice recognition software and may include unintentional dictation errors.      Pilar Jarvis, MD 11/21/23 [id number]    Pilar Jarvis, MD 11/21/23 1962    Pilar Jarvis, MD 11/21/23 2297    Pilar Jarvis, MD 11/21/23 [phone]

## 2023-11-21 NOTE — ED Notes (Signed)
 MD at bedside.

## 2023-11-21 NOTE — Discharge Instructions (Addendum)
 Drink plenty of fluids to stay well-hydrated.  Find Pedialyte or similar electrolyte rehydration formulas at your local pharmacy.  For your cough I have given you an antibiotic to take to clear up any early lung infection.  Thank you for choosing [place] for your health care today!  Please see your primary doctor this week for a follow up appointment.   If you have any new, worsening, or unexpected symptoms call your doctor right away or come back to the emergency department for reevaluation.  It was my pleasure to care for you today.   Daneil Dan [place]esto Charon, MD

## 2023-11-26 LAB — CULTURE, BLOOD (ROUTINE X 2)
Culture: NO GROWTH
Culture: NO GROWTH
Special Requests: ADEQUATE
Special Requests: ADEQUATE

## 2023-11-27 ENCOUNTER — Emergency Department
Admission: EM | Admit: 2023-11-27 | Discharge: 2023-11-27 | Disposition: A | Discharge status: Home / Self Care | Attending: Emergency Medicine | Admitting: Emergency Medicine

## 2023-11-27 ENCOUNTER — Emergency Department

## 2023-11-27 ENCOUNTER — Other Ambulatory Visit: Payer: Self-pay

## 2023-11-27 DIAGNOSIS — F17200 Nicotine dependence, unspecified, uncomplicated: Secondary | ICD-10-CM | POA: Diagnosis not present

## 2023-11-27 DIAGNOSIS — I10 Essential (primary) hypertension: Secondary | ICD-10-CM | POA: Insufficient documentation

## 2023-11-27 DIAGNOSIS — J441 Chronic obstructive pulmonary disease with (acute) exacerbation: Secondary | ICD-10-CM | POA: Diagnosis not present

## 2023-11-27 DIAGNOSIS — R079 Chest pain, unspecified: Secondary | ICD-10-CM

## 2023-11-27 DIAGNOSIS — J181 Lobar pneumonia, unspecified organism: Secondary | ICD-10-CM | POA: Insufficient documentation

## 2023-11-27 DIAGNOSIS — J189 Pneumonia, unspecified organism: Secondary | ICD-10-CM

## 2023-11-27 LAB — CBC WITH DIFFERENTIAL/PLATELET
Abs Immature Granulocytes: 0.02 10*3/uL (ref 0.00–0.07)
Basophils Absolute: 0 10*3/uL (ref 0.0–0.1)
Basophils Relative: 0 %
Eosinophils Absolute: 0.1 10*3/uL (ref 0.0–0.5)
Eosinophils Relative: 1 %
HCT: 47 % (ref 39.0–52.0)
Hemoglobin: 16 g/dL (ref 13.0–17.0)
Immature Granulocytes: 0 %
Lymphocytes Relative: 26 %
Lymphs Abs: 1.8 10*3/uL (ref 0.7–4.0)
MCH: 30.9 pg (ref 26.0–34.0)
MCHC: 34 g/dL (ref 30.0–36.0)
MCV: 90.7 fL (ref 80.0–100.0)
Monocytes Absolute: 0.8 10*3/uL (ref 0.1–1.0)
Monocytes Relative: 12 %
Neutro Abs: 4.1 10*3/uL (ref 1.7–7.7)
Neutrophils Relative %: 61 %
Platelets: 162 10*3/uL (ref 150–400)
RBC: 5.18 MIL/uL (ref 4.22–5.81)
RDW: 13.2 % (ref 11.5–15.5)
WBC: 6.8 10*3/uL (ref 4.0–10.5)
nRBC: 0 % (ref 0.0–0.2)

## 2023-11-27 LAB — MAGNESIUM: Magnesium: 2 mg/dL (ref 1.7–2.4)

## 2023-11-27 LAB — COMPREHENSIVE METABOLIC PANEL
ALT: 59 U/L — ABNORMAL HIGH (ref 0–44)
AST: 46 U/L — ABNORMAL HIGH (ref 15–41)
Albumin: 3.3 g/dL — ABNORMAL LOW (ref 3.5–5.0)
Alkaline Phosphatase: 77 U/L (ref 38–126)
Anion gap: 9 (ref 5–15)
BUN: 14 mg/dL (ref 8–23)
CO2: 26 mmol/L (ref 22–32)
Calcium: 8.3 mg/dL — ABNORMAL LOW (ref 8.9–10.3)
Chloride: 100 mmol/L (ref 98–111)
Creatinine, Ser: 1.08 mg/dL (ref 0.61–1.24)
GFR, Estimated: >60 mL/min (ref >60)
Glucose, Bld: 145 mg/dL — ABNORMAL HIGH (ref 70–99)
Potassium: 3.5 mmol/L (ref 3.5–5.1)
Sodium: 135 mmol/L (ref 135–145)
Total Bilirubin: 0.7 mg/dL (ref 0.0–1.2)
Total Protein: 6.7 g/dL (ref 6.5–8.1)

## 2023-11-27 LAB — LIPASE, BLOOD: Lipase: 28 U/L (ref 11–51)

## 2023-11-27 LAB — TROPONIN I (HIGH SENSITIVITY)
Troponin I (High Sensitivity): 6 ng/L (ref <18)
Troponin I (High Sensitivity): 5 ng/L (ref <18)

## 2023-11-27 LAB — BRAIN NATRIURETIC PEPTIDE: B Natriuretic Peptide: 7 pg/mL (ref 0.0–100.0)

## 2023-11-27 MED ORDER — PREDNISONE 20 MG PO TABS
60.0000 mg | ORAL_TABLET | Freq: Once | ORAL | Status: AC
  Administered 2023-11-27: 60 mg via ORAL
  Filled 2023-11-27: qty 3

## 2023-11-27 MED ORDER — DOXYCYCLINE HYCLATE 100 MG PO TABS
100.0000 mg | ORAL_TABLET | Freq: Two times a day (BID) | ORAL | 0 refills | Status: AC
Start: 2023-11-27 — End: 2023-12-02

## 2023-11-27 MED ORDER — ALBUTEROL SULFATE HFA 108 (90 BASE) MCG/ACT IN AERS
2.0000 | INHALATION_SPRAY | Freq: Four times a day (QID) | RESPIRATORY_TRACT | 2 refills | Status: AC | PRN
Start: 2023-11-27 — End: ?

## 2023-11-27 MED ORDER — IPRATROPIUM-ALBUTEROL 0.5-2.5 (3) MG/3ML IN SOLN
6.0000 mL | Freq: Once | RESPIRATORY_TRACT | Status: AC
Start: 2023-11-27 — End: 2023-11-27
  Administered 2023-11-27: 6 mL via RESPIRATORY_TRACT
  Filled 2023-11-27: qty 3

## 2023-11-27 MED ORDER — DOXYCYCLINE HYCLATE 100 MG PO TABS
100.0000 mg | ORAL_TABLET | Freq: Once | ORAL | Status: AC
  Administered 2023-11-27: 100 mg via ORAL
  Filled 2023-11-27: qty 1

## 2023-11-27 MED ORDER — PREDNISONE 50 MG PO TABS: 50.0000 mg | ORAL_TABLET | Freq: Every day | ORAL | 0 refills | Status: AC

## 2023-11-27 NOTE — ED Triage Notes (Signed)
 Pt arrives via ems from home with c/o CP located in center of chest that does not radiate. Pt self administered 324 ASA prior to EMS arrival. Pt rates CP 4/10 on 0-10 scale. Pt reports CP beginning at 2100 yesterday. Hx of smoking and HTN. A&Ox4.

## 2023-11-27 NOTE — ED Notes (Signed)
 The pt gave this RN consent to discuss pt's care to his son, Dannielle Huh. Writer spoke with Dannielle Huh and answered all questions.

## 2023-11-27 NOTE — Discharge Instructions (Addendum)
 Doxycycline antibiotics to take twice daily for 5 days to treat pneumonia  Prednisone steroids to take once daily for 4 more days to treat COPD/wheezing/smokers lungs  Albuterol inhaler to use every 4-6 hours as needed for any wheezing cough or shortness of breath.  1-2 puffs per use

## 2023-11-27 NOTE — ED Notes (Signed)
 This RN introduced self to pt. Call light in reach, bed wheels locked, side rail raised, pt updated on plan of care. Rounding completed.

## 2023-11-27 NOTE — ED Provider Notes (Signed)
 [facility] Provider Note    Event Date/Time   First MD Initiated Contact with Patient 11/27/23 0809     (approximate)   History   Chest Pain   HPI  Blake Rich is a 68 y.o. male who presents to the ED for evaluation of Chest Pain   Review ED visit from 1 week ago where patient was seen for chest discomfort.  Seen for presyncopal episode.  Negative age-adjusted D-dimer and fairly reassuring workup, feeling better after some fluids.  Discharged with doxycycline prescription.  Smoking history.  Patient presents to the ED for evaluation of poor sleep last night and continued intermittent chest discomfort.  Reports he was unable to fill the doxycycline prescription, he called the pharmacy and they said they never got a prescription.  Reports primary concern that his brother who was recently admitted for an ankle fracture requiring surgery is getting discharged today from the hospital to go to a rehab facility and reports poor sleep last night.   Physical Exam   Triage Vital Signs: ED Triage Vitals  Encounter Vitals Group     BP      Systolic BP Percentile      Diastolic BP Percentile      Pulse      Resp      Temp      Temp src      SpO2      Weight      Height      Head Circumference      Peak Flow      Pain Score      Pain Loc      Pain Education      Exclude from Growth Chart     Most recent vital signs: Vitals:   11/27/23 1155 11/27/23 1200  BP:  123/85  Pulse: 64 84  Resp: 14 (!) 24  Temp:    SpO2: 93% 93%    General: Awake, no distress.  CV:  Good peripheral perfusion.  Resp:  Normal effort.  Some wheezing throughout, left basilar crackles.  No distress Abd:  No distention.  MSK:  No deformity noted.  Neuro:  No focal deficits appreciated. Other:     ED Results / Procedures / Treatments   Labs (all labs ordered are listed, but only abnormal results are displayed) Labs Reviewed  COMPREHENSIVE METABOLIC PANEL -  Abnormal; Notable for the following components:      Result Value   Glucose, Bld 145 (*)    Calcium 8.3 (*)    Albumin 3.3 (*)    AST 46 (*)    ALT 59 (*)    All other components within normal limits  CBC WITH DIFFERENTIAL/PLATELET  BRAIN NATRIURETIC PEPTIDE  MAGNESIUM  LIPASE, BLOOD  TROPONIN I (HIGH SENSITIVITY)  TROPONIN I (HIGH SENSITIVITY)    EKG Low amplitude EKG with a sinus rhythm with a rate of 81 bpm.  Normal axis.  Normal intervals.  No clear signs of acute ischemia.  RADIOLOGY 2 view CXR interpreted by me with left basilar opacity  Official radiology report(s): DG Chest 2 View Result Date: 11/27/2023 CLINICAL DATA:  Nonradiating central chest pain EXAM: CHEST - 2 VIEW COMPARISON:  Chest radiograph dated 11/13/2023 FINDINGS: Normal lung volumes. Patchy left lower lung opacity. No pleural effusion or pneumothorax. The heart size and mediastinal contours are within normal limits. No acute osseous abnormality. IMPRESSION: Patchy left lower lung opacity, which may represent atelectasis, aspiration, or pneumonia. Electronically  Signed   By: Agustin Cree M.D.   On: 11/27/2023 08:47    PROCEDURES and INTERVENTIONS:  .1-3 Lead EKG Interpretation  Performed by: Delton Prairie, MD Authorized by: Delton Prairie, MD     Interpretation: normal     ECG rate:  80   ECG rate assessment: normal     Rhythm: sinus rhythm     Ectopy: none     Conduction: normal     Medications  doxycycline (VIBRA-TABS) tablet 100 mg (100 mg Oral Given 11/27/23 1039)  predniSONE (DELTASONE) tablet 60 mg (60 mg Oral Given 11/27/23 1039)  ipratropium-albuterol (DUONEB) 0.5-2.5 (3) MG/3ML nebulizer solution 6 mL (6 mLs Nebulization Given 11/27/23 1040)     IMPRESSION / MDM / ASSESSMENT AND PLAN / ED COURSE  I reviewed the triage vital signs and the nursing notes.  Differential diagnosis includes, but is not limited to, ACS, PTX, PNA, muscle strain/spasm, PE, dissection, anxiety, pleural effusion  {Patient  presents with symptoms of an acute illness or injury that is potentially life-threatening.  Patient presents with atypical chest discomfort with evidence of pneumonia suitable for trial of outpatient management.  Nonischemic EKG and 2 negative troponins.  Normal WBC and reassuring blood work overall.  CXR with a developing infiltrate when compared to 1 week ago.  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  I considered admission for this patient.  Clinical Course as of 11/27/23 1452  Thu Nov 27, 2023  0912 Never got his ABX. Poor sleep [DS]  1157 Reassessed and feeling better.  Daughter-in-law at the bedside.  They are comfortable going home. [DS]    Clinical Course User Index [DS] Delton Prairie, MD     FINAL CLINICAL IMPRESSION(S) / ED DIAGNOSES   Final diagnoses:  Chest pain, unspecified type  Community acquired pneumonia of left lower lobe of lung  COPD exacerbation (HCC)     Rx / DC Orders   ED Discharge Orders          Ordered    doxycycline (VIBRA-TABS) 100 MG tablet  2 times daily        11/27/23 1202    predniSONE (DELTASONE) 50 MG tablet  Daily        11/27/23 1202    albuterol (VENTOLIN HFA) 108 (90 Base) MCG/ACT inhaler  Every 6 hours PRN        11/27/23 1202             Note:  This document was prepared using Dragon voice recognition software and may include unintentional dictation errors.   Delton Prairie, MD 11/27/23 [phone]
